# Patient Record
Sex: Female | Born: 2000
Health system: Southern US, Community
[De-identification: ages and names within clinical notes are randomized; demographics above are authoritative.]

## PROBLEM LIST (undated history)

## (undated) DIAGNOSIS — G43909 Migraine, unspecified, not intractable, without status migrainosus: Secondary | ICD-10-CM

## (undated) HISTORY — PX: NO PAST SURGERIES: SHX2092

## (undated) HISTORY — DX: Migraine, unspecified, not intractable, without status migrainosus: G43.909

---
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
## 2022-08-07 ENCOUNTER — Encounter: Payer: Self-pay | Admitting: Cardiology

## 2022-08-07 ENCOUNTER — Ambulatory Visit (INDEPENDENT_AMBULATORY_CARE_PROVIDER_SITE_OTHER): Payer: Federal, State, Local not specified - PPO

## 2022-08-07 ENCOUNTER — Ambulatory Visit (INDEPENDENT_AMBULATORY_CARE_PROVIDER_SITE_OTHER): Payer: Federal, State, Local not specified - PPO | Admitting: Cardiology

## 2022-08-07 VITALS — BP 100/60 | HR 83 | Ht 61.0 in | Wt 103.8 lb

## 2022-08-07 DIAGNOSIS — R002 Palpitations: Secondary | ICD-10-CM

## 2022-08-07 DIAGNOSIS — R55 Syncope and collapse: Secondary | ICD-10-CM | POA: Diagnosis not present

## 2022-08-07 NOTE — Progress Notes (Unsigned)
Enrolled for [employer] to mail a ZIO XT long term holter monitor to the patients address on file.  

## 2022-08-07 NOTE — Progress Notes (Signed)
Cardiology Office Note:    Date:  08/07/2022   ID:  Levy, Amy Nov 01, 2001, MRN [id number]  PCP:  Donetta Potts, MD  Cardiologist:  Thomasene Ripple, DO  Electrophysiologist:  None   Referring MD: Donetta Potts, MD   " I having palpitations.  History of Present Illness:    Amy Levy is a 21 y.o. female with a hx of migraine headaches, syncope as a child which really faded away in teenage years, recently with significant palpitations with leads to significant lightheadedness.  She has been started on metoprolol succinate by her primary care doctor.  But tells me that she is still experiencing intermittent symptoms.  She notes that sometimes when she is experiencing the symptoms her heart rate goes really fast she feels lightheaded she has to sit down before she passed out.  She has not really had any significant syncope episode.  He is here with her mother they are both concerned.  She denies any chest pain or any shortness of breath.  Past Medical History:  Diagnosis Date   Migraines     Past Surgical History:  Procedure Laterality Date   NO PAST SURGERIES      Current Medications: Current Meds  Medication Sig   metoprolol succinate (TOPROL-XL) 25 MG 24 hr tablet Take 25 mg by mouth daily.   norethindrone-ethinyl estradiol-iron (JUNEL FE 1.5/30) 1.5-30 MG-MCG tablet Take 1 tablet by mouth daily. Do not take placebos     Allergies:   Patient has no known allergies.   Social History   Socioeconomic History   Marital status: Single    Spouse name: Not on file   Number of children: Not on file   Years of education: Not on file   Highest education level: Not on file  Occupational History   Occupation: Student  Tobacco Use   Smoking status: Never   Smokeless tobacco: Never  Vaping Use   Vaping Use: Never used  Substance and Sexual Activity   Alcohol use: No   Drug use: No   Sexual activity: Yes    Birth control/protection: Pill  Other Topics  Concern   Not on file  Social History Narrative   Not on file   Social Determinants of Health   Financial Resource Strain: Low Risk  (07/22/2021)   Overall Financial Resource Strain (CARDIA)    Difficulty of Paying Living Expenses: Not hard at all  Food Insecurity: No Food Insecurity (07/22/2021)   Hunger Vital Sign    Worried About Running Out of Food in the Last Year: Never true    Ran Out of Food in the Last Year: Never true  Transportation Needs: No Transportation Needs (07/22/2021)   PRAPARE - Administrator, Civil Service (Medical): No    Lack of Transportation (Non-Medical): No  Physical Activity: Insufficiently Active (07/22/2021)   Exercise Vital Sign    Days of Exercise per Week: 3 days    Minutes of Exercise per Session: 20 min  Stress: No Stress Concern Present (07/22/2021)   [employer] of Occupational Health - Occupational Stress Questionnaire    Feeling of Stress : Only a little  Social Connections: Socially Isolated (07/22/2021)   Social Connection and Isolation Panel [NHANES]    Frequency of Communication with Friends and Family: Once a week    Frequency of Social Gatherings with Friends and Family: Once a week    Attends Religious Services: Never    Database administrator or  Organizations: No    Attends Banker Meetings: Never    Marital Status: Never married     Family History: The patient's family history includes Heart attack in her maternal grandfather.  ROS:   Review of Systems  Constitution: Negative for decreased appetite, fever and weight gain.  HENT: Negative for congestion, ear discharge, hoarse voice and sore throat.   Eyes: Negative for discharge, redness, vision loss in right eye and visual halos.  Cardiovascular: Reports palpitation.  Negative for chest pain, dyspnea on exertion, leg swelling, orthopnea Respiratory: Negative for cough, hemoptysis, shortness of breath and snoring.   Endocrine: Negative for heat  intolerance and polyphagia.  Hematologic/Lymphatic: Negative for bleeding problem. Does not bruise/bleed easily.  Skin: Negative for flushing, nail changes, rash and suspicious lesions.  Musculoskeletal: Negative for arthritis, joint pain, muscle cramps, myalgias, neck pain and stiffness.  Gastrointestinal: Negative for abdominal pain, bowel incontinence, diarrhea and excessive appetite.  Genitourinary: Negative for decreased libido, genital sores and incomplete emptying.  Neurological: Negative for brief paralysis, focal weakness, headaches and loss of balance.  Psychiatric/Behavioral: Negative for altered mental status, depression and suicidal ideas.  Allergic/Immunologic: Negative for HIV exposure and persistent infections.    EKGs/Labs/Other Studies Reviewed:    The following studies were reviewed today:   EKG:  The ekg ordered today demonstrates sinus rhythm, with sinus arrhythmia.  Recent Labs: No results found for requested labs within last 365 days.  Recent Lipid Panel No results found for: "CHOL", "TRIG", "HDL", "CHOLHDL", "VLDL", "LDLCALC", "LDLDIRECT"  Physical Exam:    VS:  BP 100/60   Pulse 83   Ht 5\' 1"  (1.549 m)   Wt 103 lb 12.8 oz (47.1 kg)   SpO2 99%   BMI 19.61 kg/m     Wt Readings from Last 3 Encounters:  08/07/22 103 lb 12.8 oz (47.1 kg) (7 %, Z= -1.45)*  07/22/21 108 lb 8 oz (49.2 kg) (17 %, Z= -0.95)*  10/12/20 100 lb 12.8 oz (45.7 kg) (7 %, Z= -1.45)*   * Growth percentiles are based on CDC (Girls, 2-20 Years) data.     GEN: Well nourished, well developed in no acute distress HEENT: Normal NECK: No JVD; No carotid bruits LYMPHATICS: No lymphadenopathy CARDIAC: S1S2 noted,RRR, no murmurs, rubs, gallops RESPIRATORY:  Clear to auscultation without rales, wheezing or rhonchi  ABDOMEN: Soft, non-tender, non-distended, +bowel sounds, no guarding. EXTREMITIES: No edema, No cyanosis, no clubbing MUSCULOSKELETAL:  No deformity  SKIN: Warm and  dry NEUROLOGIC:  Alert and oriented x 3, non-focal PSYCHIATRIC:  Normal affect, good insight  ASSESSMENT:    1. Palpitations   2. Pre-syncope    PLAN:     I would like to rule out a cardiovascular etiology of this palpitation and presyncope, therefore at this time I would like to placed a zio patch for   14 days. In additon a transthoracic echocardiogram will be ordered to assess LV/RV function and any structural abnormalities. Once these testing have been performed amd reviewed further reccomendations will be made. For now, I do reccomend that the patient goes to the nearest ED if  symptoms recur.  For now we will keep her on the metoprolol.  Ideally I suspect there may be some subclinical anxiety which transitioning her to propranolol will be the best.  The patient is in agreement with the above plan. The patient left the office in stable condition.  The patient will follow up in 6 months or sooner if needed.   Medication  Adjustments/Labs and Tests Ordered: Current medicines are reviewed at length with the patient today.  Concerns regarding medicines are outlined above.  Orders Placed This Encounter  Procedures   LONG TERM MONITOR (3-14 DAYS)   EKG 12-Lead   ECHOCARDIOGRAM COMPLETE   No orders of the defined types were placed in this encounter.   Patient Instructions  Medication Instructions:  Your Physician recommend you continue on your current medication as directed.    *If you need a refill on your cardiac medications before your next appointment, please call your pharmacy*   Lab Work: None ordered today   Testing/Procedures: Your physician has requested that you have an echocardiogram. Echocardiography is a painless test that uses sound waves to create images of your heart. It provides your doctor with information about the size and shape of your heart and how well your heart's chambers and valves are working. This procedure takes approximately one hour. There are no  restrictions for this procedure. [address]  Your physician has recommended that you wear a 14 day Zio monitor.   This monitor is a medical device that records the heart's electrical activity. Doctors most often use these monitors to diagnose arrhythmias. Arrhythmias are problems with the speed or rhythm of the heartbeat. The monitor is a small device applied to your chest. You can wear one while you do your normal daily activities. While wearing this monitor if you have any symptoms to push the button and record what you felt. Once you have worn this monitor for the period of time provider prescribed (Usually 14 days), you will return the monitor device in the postage paid box. Once it is returned they will download the data collected and provide [place] with a report which the provider will then review and we will call you with those results. Important tips:  Avoid showering during the first 24 hours of wearing the monitor. Avoid excessive sweating to help maximize wear time. Do not submerge the device, no hot tubs, and no swimming pools. Keep any lotions or oils away from the patch. After 24 hours you may shower with the patch on. Take brief showers with your back facing the shower head.  Do not remove patch once it has been placed because that will interrupt data and decrease adhesive wear time. Push the button when you have any symptoms and write down what you were feeling. Once you have completed wearing your monitor, remove and place into box which has postage paid and place in your outgoing mailbox.  If for some reason you have misplaced your box then call our office and we can provide another box and/or mail it off for you.    Follow-Up: At [facility], you and your health needs are our priority.  As part of our continuing mission to provide you with exceptional heart care, we have created designated Provider Care Teams.  These Care Teams include your primary  Cardiologist (physician) and Advanced Practice Providers (APPs -  Physician Assistants and Nurse Practitioners) who all work together to provide you with the care you need, when you need it.  We recommend signing up for the patient portal called "MyChart".  Sign up information is provided on this After Visit Summary.  MyChart is used to connect with patients for Virtual Visits (Telemedicine).  Patients are able to view lab/test results, encounter notes, upcoming appointments, etc.  Non-urgent messages can be sent to your provider as well.   To learn more about  what you can do with MyChart, go to [email or web address].    Your next appointment:   4 month(s)  The format for your next appointment:   In Person  Provider:   Dr. Servando Salina          Adopting a Healthy Lifestyle.  Know what a healthy weight is for you (roughly BMI <25) and aim to maintain this   Aim for 7+ servings of fruits and vegetables daily   65-80+ fluid ounces of water or unsweet tea for healthy kidneys   Limit to max 1 drink of alcohol per day; avoid smoking/tobacco   Limit animal fats in diet for cholesterol and heart health - choose grass fed whenever available   Avoid highly processed foods, and foods high in saturated/trans fats   Aim for low stress - take time to unwind and care for your mental health   Aim for 150 min of moderate intensity exercise weekly for heart health, and weights twice weekly for bone health   Aim for 7-9 hours of sleep daily   When it comes to diets, agreement about the perfect plan isnt easy to find, even among the experts. Experts at the [facility] of [employer] developed an idea known as the Healthy Eating Plate. Just imagine a plate divided into logical, healthy portions.   The emphasis is on diet quality:   Load up on vegetables and fruits - one-half of your plate: Aim for color and variety, and remember that potatoes dont count.   Go for whole grains -  one-quarter of your plate: Whole wheat, barley, wheat berries, quinoa, oats, brown rice, and foods made with them. If you want pasta, go with whole wheat pasta.   Protein power - one-quarter of your plate: Fish, chicken, beans, and nuts are all healthy, versatile protein sources. Limit red meat.   The diet, however, does go beyond the plate, offering a few other suggestions.   Use healthy plant oils, such as olive, canola, soy, corn, sunflower and peanut. Check the labels, and avoid partially hydrogenated oil, which have unhealthy trans fats.   If youre thirsty, drink water. Coffee and tea are good in moderation, but skip sugary drinks and limit milk and dairy products to one or two daily servings.   The type of carbohydrate in the diet is more important than the amount. Some sources of carbohydrates, such as vegetables, fruits, whole grains, and beans-are healthier than others.   Finally, stay active  Signed, Thomasene Ripple, DO  08/07/2022 3:20 PM    [facility]

## 2022-08-07 NOTE — Patient Instructions (Addendum)
Medication Instructions:  Your Physician recommend you continue on your current medication as directed.    *If you need a refill on your cardiac medications before your next appointment, please call your pharmacy*   Lab Work: None ordered today   Testing/Procedures: Your physician has requested that you have an echocardiogram. Echocardiography is a painless test that uses sound waves to create images of your heart. It provides your doctor with information about the size and shape of your heart and how well your heart's chambers and valves are working. This procedure takes approximately one hour. There are no restrictions for this procedure. [address]  Your physician has recommended that you wear a 14 day Zio monitor.   This monitor is a medical device that records the heart's electrical activity. Doctors most often use these monitors to diagnose arrhythmias. Arrhythmias are problems with the speed or rhythm of the heartbeat. The monitor is a small device applied to your chest. You can wear one while you do your normal daily activities. While wearing this monitor if you have any symptoms to push the button and record what you felt. Once you have worn this monitor for the period of time provider prescribed (Usually 14 days), you will return the monitor device in the postage paid box. Once it is returned they will download the data collected and provide [place] with a report which the provider will then review and we will call you with those results. Important tips:  Avoid showering during the first 24 hours of wearing the monitor. Avoid excessive sweating to help maximize wear time. Do not submerge the device, no hot tubs, and no swimming pools. Keep any lotions or oils away from the patch. After 24 hours you may shower with the patch on. Take brief showers with your back facing the shower head.  Do not remove patch once it has been placed because that will interrupt data and  decrease adhesive wear time. Push the button when you have any symptoms and write down what you were feeling. Once you have completed wearing your monitor, remove and place into box which has postage paid and place in your outgoing mailbox.  If for some reason you have misplaced your box then call our office and we can provide another box and/or mail it off for you.    Follow-Up: At [facility], you and your health needs are our priority.  As part of our continuing mission to provide you with exceptional heart care, we have created designated Provider Care Teams.  These Care Teams include your primary Cardiologist (physician) and Advanced Practice Providers (APPs -  Physician Assistants and Nurse Practitioners) who all work together to provide you with the care you need, when you need it.  We recommend signing up for the patient portal called "MyChart".  Sign up information is provided on this After Visit Summary.  MyChart is used to connect with patients for Virtual Visits (Telemedicine).  Patients are able to view lab/test results, encounter notes, upcoming appointments, etc.  Non-urgent messages can be sent to your provider as well.   To learn more about what you can do with MyChart, go to [email or web address].    Your next appointment:   4 month(s)  The format for your next appointment:   In Person  Provider:   Dr. Servando Salina

## 2022-08-14 ENCOUNTER — Ambulatory Visit (HOSPITAL_COMMUNITY)
Admission: RE | Admit: 2022-08-14 | Discharge: 2022-08-14 | Disposition: A | Discharge status: Home / Self Care | Payer: Federal, State, Local not specified - PPO | Source: Ambulatory Visit | Attending: Cardiology | Admitting: Cardiology

## 2022-08-14 DIAGNOSIS — R55 Syncope and collapse: Secondary | ICD-10-CM | POA: Insufficient documentation

## 2022-08-14 DIAGNOSIS — R002 Palpitations: Secondary | ICD-10-CM | POA: Diagnosis not present

## 2022-08-14 LAB — ECHOCARDIOGRAM COMPLETE
AR max vel: 2.23 cm2
AV Area VTI: 2.23 cm2
AV Area mean vel: 2.13 cm2
AV Mean grad: 3 mmHg
AV Peak grad: 4.8 mmHg
Ao pk vel: 1.1 m/s
Area-P 1/2: 6.6 cm2
Calc EF: 61.3 %
MV VTI: 2.5 cm2
S' Lateral: 3.4 cm
Single Plane A2C EF: 69.1 %
Single Plane A4C EF: 54.5 %

## 2022-08-14 NOTE — Progress Notes (Signed)
*  PRELIMINARY RESULTS* Echocardiogram 2D Echocardiogram has been performed.  Carolyne Fiscal 08/14/2022, 2:02 PM

## 2022-08-15 DIAGNOSIS — R55 Syncope and collapse: Secondary | ICD-10-CM | POA: Diagnosis not present

## 2022-08-15 DIAGNOSIS — R002 Palpitations: Secondary | ICD-10-CM | POA: Diagnosis not present

## 2022-12-15 ENCOUNTER — Ambulatory Visit: Payer: Federal, State, Local not specified - PPO | Attending: Cardiology | Admitting: Cardiology

## 2022-12-15 ENCOUNTER — Encounter: Payer: Self-pay | Admitting: Cardiology

## 2022-12-15 VITALS — BP 92/72 | HR 84 | Ht 61.0 in | Wt 104.6 lb

## 2022-12-15 DIAGNOSIS — I493 Ventricular premature depolarization: Secondary | ICD-10-CM | POA: Diagnosis not present

## 2022-12-15 NOTE — Progress Notes (Signed)
Cardiology Office Note:    Date:  12/15/2022   ID:  Collyn, Selk [birth year], MRN [id number]  PCP:  Donetta Potts, MD  Cardiologist:  Thomasene Ripple, DO  Electrophysiologist:  None   Referring MD: Donetta Potts, MD    " I am ok   History of Present Illness:    Amy Levy is a 21 y.o. female with a hx of symptomatic rare premature ventricular complexes.  I saw the patient in August 2023 at that time she was experiencing symptoms I placed a monitor on the patient.  Was again echocardiogram which was normal. We started her on Toprol-XL and recently she had ran out of this medication.  But she tells me that she is feeling a lot better.  No complaints at this time.  Past Medical History:  Diagnosis Date   Migraines     Past Surgical History:  Procedure Laterality Date   NO PAST SURGERIES      Current Medications: Current Meds  Medication Sig   norethindrone-ethinyl estradiol-iron (JUNEL FE 1.5/30) 1.5-30 MG-MCG tablet Take 1 tablet by mouth daily. Do not take placebos     Allergies:   Patient has no known allergies.   Social History   Socioeconomic History   Marital status: Single    Spouse name: Not on file   Number of children: Not on file   Years of education: Not on file   Highest education level: Not on file  Occupational History   Occupation: Student  Tobacco Use   Smoking status: Never   Smokeless tobacco: Never  Vaping Use   Vaping Use: Never used  Substance and Sexual Activity   Alcohol use: No   Drug use: No   Sexual activity: Yes    Birth control/protection: Pill  Other Topics Concern   Not on file  Social History Narrative   Not on file   Social Determinants of Health   Financial Resource Strain: Low Risk  (07/22/2021)   Overall Financial Resource Strain (CARDIA)    Difficulty of Paying Living Expenses: Not hard at all  Food Insecurity: No Food Insecurity (07/22/2021)   Hunger Vital Sign    Worried About Running Out  of Food in the Last Year: Never true    Ran Out of Food in the Last Year: Never true  Transportation Needs: No Transportation Needs (07/22/2021)   PRAPARE - Administrator, Civil Service (Medical): No    Lack of Transportation (Non-Medical): No  Physical Activity: Insufficiently Active (07/22/2021)   Exercise Vital Sign    Days of Exercise per Week: 3 days    Minutes of Exercise per Session: 20 min  Stress: No Stress Concern Present (07/22/2021)   [employer] of Occupational Health - Occupational Stress Questionnaire    Feeling of Stress : Only a little  Social Connections: Socially Isolated (07/22/2021)   Social Connection and Isolation Panel [NHANES]    Frequency of Communication with Friends and Family: Once a week    Frequency of Social Gatherings with Friends and Family: Once a week    Attends Religious Services: Never    Database administrator or Organizations: No    Attends Engineer, structural: Never    Marital Status: Never married     Family History: The patient's family history includes Heart attack in her maternal grandfather.  ROS:   Review of Systems  Constitution: Negative for decreased appetite, fever and weight gain.  HENT: Negative  for congestion, ear discharge, hoarse voice and sore throat.   Eyes: Negative for discharge, redness, vision loss in right eye and visual halos.  Cardiovascular: Negative for chest pain, dyspnea on exertion, leg swelling, orthopnea and palpitations.  Respiratory: Negative for cough, hemoptysis, shortness of breath and snoring.   Endocrine: Negative for heat intolerance and polyphagia.  Hematologic/Lymphatic: Negative for bleeding problem. Does not bruise/bleed easily.  Skin: Negative for flushing, nail changes, rash and suspicious lesions.  Musculoskeletal: Negative for arthritis, joint pain, muscle cramps, myalgias, neck pain and stiffness.  Gastrointestinal: Negative for abdominal pain, bowel incontinence,  diarrhea and excessive appetite.  Genitourinary: Negative for decreased libido, genital sores and incomplete emptying.  Neurological: Negative for brief paralysis, focal weakness, headaches and loss of balance.  Psychiatric/Behavioral: Negative for altered mental status, depression and suicidal ideas.  Allergic/Immunologic: Negative for HIV exposure and persistent infections.    EKGs/Labs/Other Studies Reviewed:    The following studies were reviewed today:   EKG: None today  ZIO monitor September 11, 2022   Patch Wear Time:  13 days and 23 hours (2023-08-18T21:31:31-0400 to 2023-09-01T21:31:27-0400)   Patient had a min HR of 45 bpm, max HR of 205 bpm, and avg HR of 87 bpm. Predominant underlying rhythm was Sinus Rhythm. Isolated SVEs were rare (<1.0%), SVE Triplets were rare (<1.0%), and no SVE Couplets were present. Isolated VEs were rare (<1.0%), VE  Couplets were rare (<1.0%), and no VE Triplets were present. Ventricular Trigeminy was present.    Symptoms associated with sinus tachycardia and rare premature ventricular complex.   Conclusion: Symptomatic rare premature ventricular complex.   Echocardiogram 08/14/2022 IMPRESSIONS     1. Left ventricular ejection fraction, by estimation, is 55%. The left  ventricle has low normal function. The left ventricle has no regional wall  motion abnormalities. Left ventricular diastolic parameters were normal.   2. Right ventricular systolic function is normal. The right ventricular  size is normal.   3. The mitral valve is normal in structure. No evidence of mitral valve  regurgitation. No evidence of mitral stenosis.   4. The aortic valve is tricuspid. Aortic valve regurgitation is not  visualized. No aortic stenosis is present.   5. The inferior vena cava is normal in size with greater than 50%  respiratory variability, suggesting right atrial pressure of 3 mmHg.   FINDINGS   Left Ventricle: Left ventricular ejection fraction, by  estimation, is  55%. The left ventricle has low normal function. The left ventricle has no  regional wall motion abnormalities. The left ventricular internal cavity  size was normal in size. There is  no left ventricular hypertrophy. Left ventricular diastolic parameters  were normal.   Right Ventricle: The right ventricular size is normal. No increase in  right ventricular wall thickness. Right ventricular systolic function is  normal.   Left Atrium: Left atrial size was normal in size.   Right Atrium: Right atrial size was normal in size.   Pericardium: There is no evidence of pericardial effusion.   Mitral Valve: The mitral valve is normal in structure. No evidence of  mitral valve regurgitation. No evidence of mitral valve stenosis. MV peak  gradient, 2.4 mmHg. The mean mitral valve gradient is 1.0 mmHg.   Tricuspid Valve: The tricuspid valve is normal in structure. Tricuspid  valve regurgitation is trivial. No evidence of tricuspid stenosis.   Aortic Valve: The aortic valve is tricuspid. Aortic valve regurgitation is  not visualized. No aortic stenosis is present. Aortic valve mean  gradient  measures 3.0 mmHg. Aortic valve peak gradient measures 4.8 mmHg. Aortic  valve area, by VTI measures 2.23  cm.   Pulmonic Valve: The pulmonic valve was normal in structure. Pulmonic valve  regurgitation is not visualized. No evidence of pulmonic stenosis.   Aorta: The aortic root is normal in size and structure.   Venous: The inferior vena cava is normal in size with greater than 50%  respiratory variability, suggesting right atrial pressure of 3 mmHg.   IAS/Shunts: No atrial level shunt detected by color flow Doppler.         Recent Labs: No results found for requested labs within last 365 days.  Recent Lipid Panel No results found for: "CHOL", "TRIG", "HDL", "CHOLHDL", "VLDL", "LDLCALC", "LDLDIRECT"  Physical Exam:    VS:  BP 92/72   Pulse 84   Ht 5\' 1"  (1.549 m)   Wt  104 lb 9.6 oz (47.4 kg)   SpO2 99%   BMI 19.76 kg/m     Wt Readings from Last 3 Encounters:  12/15/22 104 lb 9.6 oz (47.4 kg) (8 %, Z= -1.41)*  08/07/22 103 lb 12.8 oz (47.1 kg) (7 %, Z= -1.45)*  07/22/21 108 lb 8 oz (49.2 kg) (17 %, Z= -0.95)*   * Growth percentiles are based on CDC (Girls, 2-20 Years) data.     GEN: Well nourished, well developed in no acute distress HEENT: Normal NECK: No JVD; No carotid bruits LYMPHATICS: No lymphadenopathy CARDIAC: S1S2 noted,RRR, no murmurs, rubs, gallops RESPIRATORY:  Clear to auscultation without rales, wheezing or rhonchi  ABDOMEN: Soft, non-tender, non-distended, +bowel sounds, no guarding. EXTREMITIES: No edema, No cyanosis, no clubbing MUSCULOSKELETAL:  No deformity  SKIN: Warm and dry NEUROLOGIC:  Alert and oriented x 3, non-focal PSYCHIATRIC:  Normal affect, good insight  ASSESSMENT:    1. Symptomatic PVCs    PLAN:     We discussed the test result again.  She has ran out of the metoprolol but in the last 2 weeks she has had no symptoms.  For now we will hold off on starting any additional medication.  She will let me know if she has symptoms.  Neck step will be likely propranolol.  The patient is in agreement with the above plan. The patient left the office in stable condition.  The patient will follow up in   Medication Adjustments/Labs and Tests Ordered: Current medicines are reviewed at length with the patient today.  Concerns regarding medicines are outlined above.  No orders of the defined types were placed in this encounter.  No orders of the defined types were placed in this encounter.   Patient Instructions  Medication Instructions:  Your physician recommends that you continue on your current medications as directed. Please refer to the Current Medication list given to you today.  *If you need a refill on your cardiac medications before your next appointment, please call your pharmacy*   Lab Work: NONE If you  have labs (blood work) drawn today and your tests are completely normal, you will receive your results only by: MyChart Message (if you have MyChart) OR A paper copy in the mail If you have any lab test that is abnormal or we need to change your treatment, we will call you to review the results.   Testing/Procedures: NONE   Follow-Up: At [facility], you and your health needs are our priority.  As part of our continuing mission to provide you with exceptional heart care, we have created  designated Provider Care Teams.  These Care Teams include your primary Cardiologist (physician) and Advanced Practice Providers (APPs -  Physician Assistants and Nurse Practitioners) who all work together to provide you with the care you need, when you need it.  We recommend signing up for the patient portal called "MyChart".  Sign up information is provided on this After Visit Summary.  MyChart is used to connect with patients for Virtual Visits (Telemedicine).  Patients are able to view lab/test results, encounter notes, upcoming appointments, etc.  Non-urgent messages can be sent to your provider as well.   To learn more about what you can do with MyChart, go to [email or web address].    Your next appointment:   1 year(s)  The format for your next appointment:   In Person  Provider:   Thomasene Ripple, DO     Adopting a Healthy Lifestyle.  Know what a healthy weight is for you (roughly BMI <25) and aim to maintain this   Aim for 7+ servings of fruits and vegetables daily   65-80+ fluid ounces of water or unsweet tea for healthy kidneys   Limit to max 1 drink of alcohol per day; avoid smoking/tobacco   Limit animal fats in diet for cholesterol and heart health - choose grass fed whenever available   Avoid highly processed foods, and foods high in saturated/trans fats   Aim for low stress - take time to unwind and care for your mental health   Aim for 150 min of moderate intensity  exercise weekly for heart health, and weights twice weekly for bone health   Aim for 7-9 hours of sleep daily   When it comes to diets, agreement about the perfect plan isnt easy to find, even among the experts. Experts at the [facility] [employer] developed an idea known as the Healthy Eating Plate. Just imagine a plate divided into logical, healthy portions.   The emphasis is on diet quality:   Load up on vegetables and fruits - one-half of your plate: Aim for color and variety, and remember that potatoes dont count.   Go for whole grains - one-quarter of your plate: Whole wheat, barley, wheat berries, quinoa, oats, brown rice, and foods made with them. If you want pasta, go with whole wheat pasta.   Protein power - one-quarter of your plate: Fish, chicken, beans, and nuts are all healthy, versatile protein sources. Limit red meat.   The diet, however, does go beyond the plate, offering a few other suggestions.   Use healthy plant oils, such as olive, canola, soy, corn, sunflower and peanut. Check the labels, and avoid partially hydrogenated oil, which have unhealthy trans fats.   If youre thirsty, drink water. Coffee and tea are good in moderation, but skip sugary drinks and limit milk and dairy products to one or two daily servings.   The type of carbohydrate in the diet is more important than the amount. Some sources of carbohydrates, such as vegetables, fruits, whole grains, and beans-are healthier than others.   Finally, stay active  Signed, Thomasene Ripple, DO  12/15/2022 2:14 PM    [facility]

## 2022-12-15 NOTE — Patient Instructions (Signed)
Medication Instructions:  Your physician recommends that you continue on your current medications as directed. Please refer to the Current Medication list given to you today.  *If you need a refill on your cardiac medications before your next appointment, please call your pharmacy*   Lab Work: NONE If you have labs (blood work) drawn today and your tests are completely normal, you will receive your results only by: MyChart Message (if you have MyChart) OR A paper copy in the mail If you have any lab test that is abnormal or we need to change your treatment, we will call you to review the results.   Testing/Procedures: NONE   Follow-Up: At  [facility], you and your health needs are our priority.  As part of our continuing mission to provide you with exceptional heart care, we have created designated Provider Care Teams.  These Care Teams include your primary Cardiologist (physician) and Advanced Practice Providers (APPs -  Physician Assistants and Nurse Practitioners) who all work together to provide you with the care you need, when you need it.  We recommend signing up for the patient portal called "MyChart".  Sign up information is provided on this After Visit Summary.  MyChart is used to connect with patients for Virtual Visits (Telemedicine).  Patients are able to view lab/test results, encounter notes, upcoming appointments, etc.  Non-urgent messages can be sent to your provider as well.   To learn more about what you can do with MyChart, go to [email or web address].    Your next appointment:   1 year(s)  The format for your next appointment:   In Person  Provider:   Kardie Tobb, DO
